# Patient Record
Sex: Female | Born: 1955 | Race: Black or African American | Hispanic: No | Marital: Married | Smoking: Current every day smoker
Health system: Southern US, Community
[De-identification: ages and names within clinical notes are randomized; demographics above are authoritative.]

---

## 2006-09-04 ENCOUNTER — Emergency Department: Payer: Self-pay | Admitting: Emergency Medicine

## 2006-09-04 ENCOUNTER — Other Ambulatory Visit: Payer: Self-pay

## 2008-01-14 ENCOUNTER — Emergency Department: Payer: Self-pay | Admitting: Emergency Medicine

## 2008-01-21 ENCOUNTER — Emergency Department: Payer: Self-pay | Admitting: Emergency Medicine

## 2009-02-23 ENCOUNTER — Emergency Department: Payer: Self-pay | Admitting: Emergency Medicine

## 2009-02-24 ENCOUNTER — Ambulatory Visit: Payer: Self-pay | Admitting: Internal Medicine

## 2009-03-08 ENCOUNTER — Ambulatory Visit: Payer: Self-pay | Admitting: Internal Medicine

## 2009-06-19 ENCOUNTER — Emergency Department: Payer: Self-pay | Admitting: Internal Medicine

## 2009-06-20 ENCOUNTER — Emergency Department: Payer: Self-pay | Admitting: Emergency Medicine

## 2011-05-07 ENCOUNTER — Emergency Department: Payer: Self-pay | Admitting: Emergency Medicine

## 2011-09-03 IMAGING — CT CT HEAD WITHOUT CONTRAST
2 series · 15 of 30 positions shown, 19 images · non-contrast
Comparison: none

REASON FOR EXAM: ha  possible seixure, near syncope    pt in [facility] [place]
COMMENTS:

PROCEDURE:     CT  - CT HEAD WITHOUT CONTRAST  - June 19, 2009 [DATE]
RESULT:     Comparison:  None
TECHNIQUE: Multiple axial images from the foramen magnum to the vertex were
obtained without IV contrast.

[Series 2: without · axial · non-contrast · 0.41mm/px · z∈[-150,-30]mm · 13 of 29 slices shown, 17 images]
[im 3/29  brain]
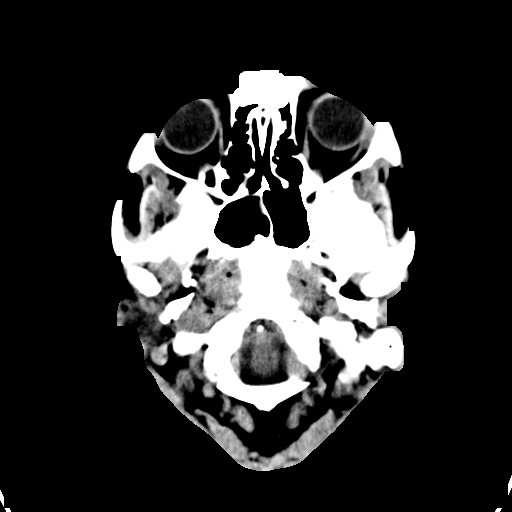
[im 3/29  bone]
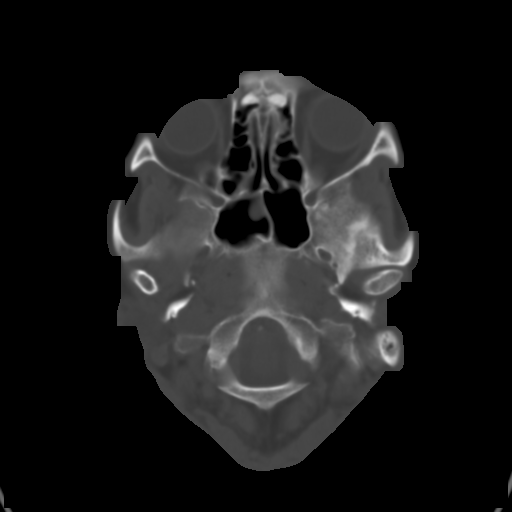
[im 5/29  brain]
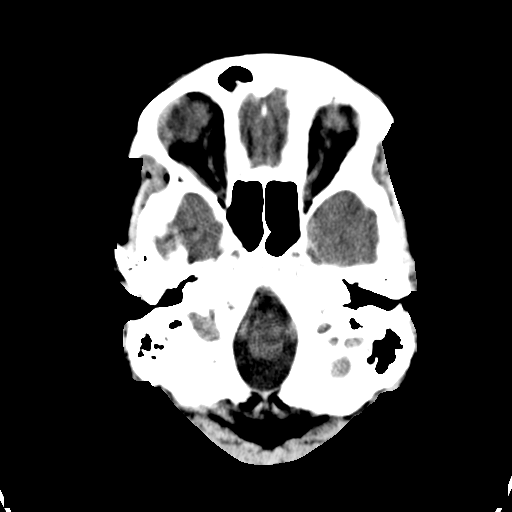
[im 7/29  brain]
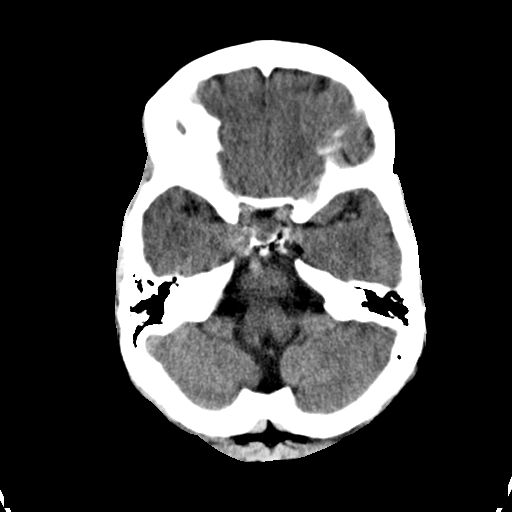
[im 9/29  brain]
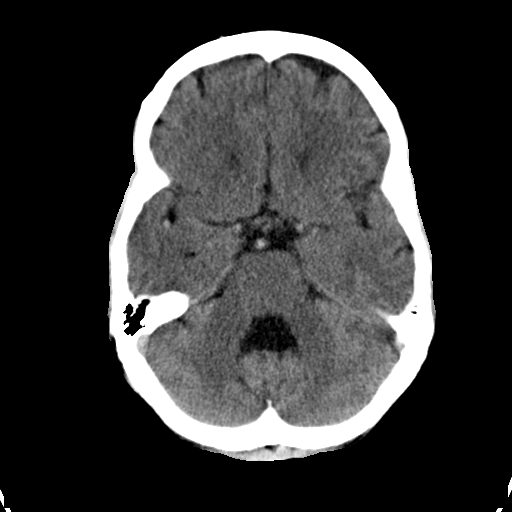
[im 11/29  brain]
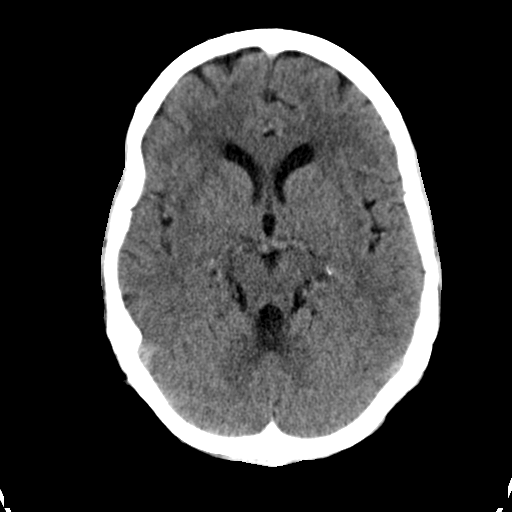
[im 11/29  bone]
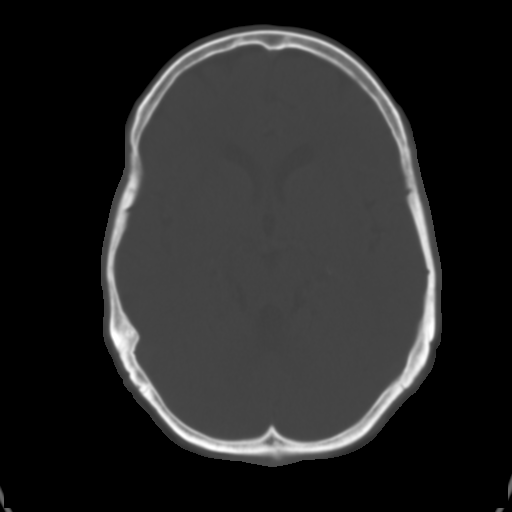
[im 13/29  brain]
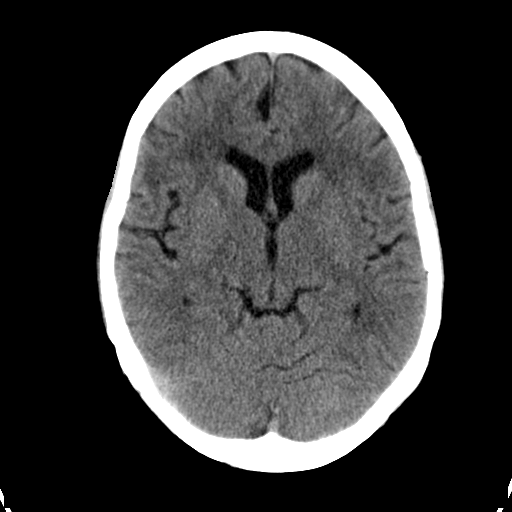
[im 15/29  brain]
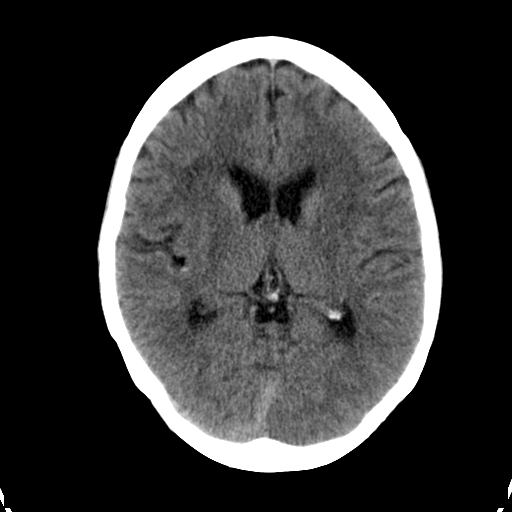
[im 17/29  brain]
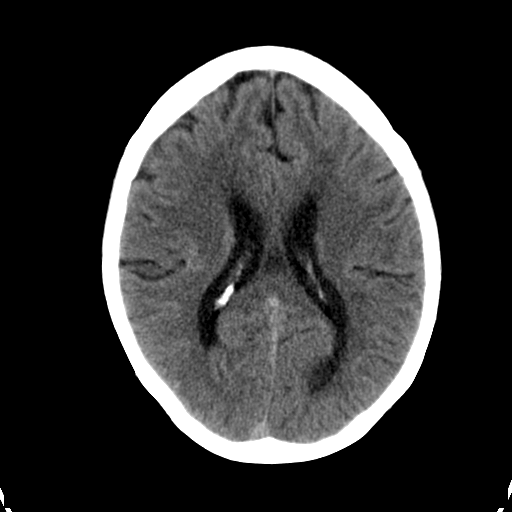
[im 19/29  brain]
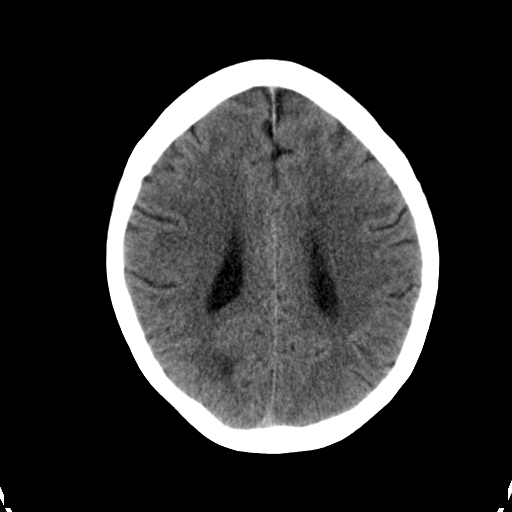
[im 19/29  bone]
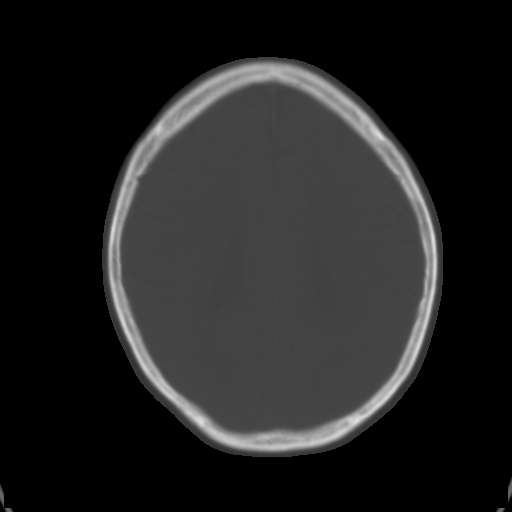
[im 21/29  brain]
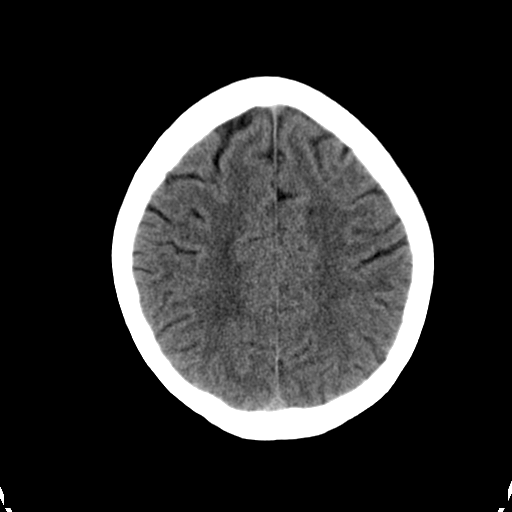
[im 23/29  brain]
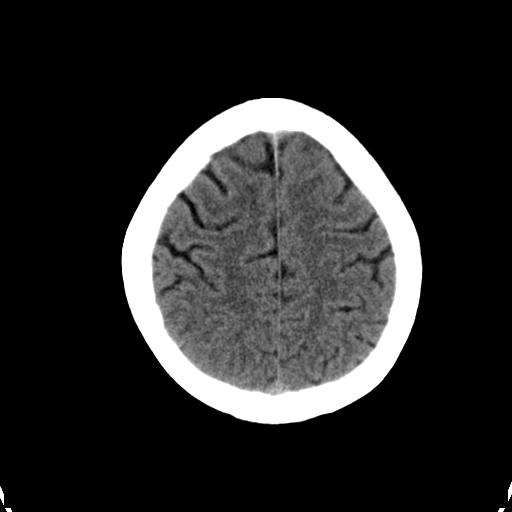
[im 25/29  brain]
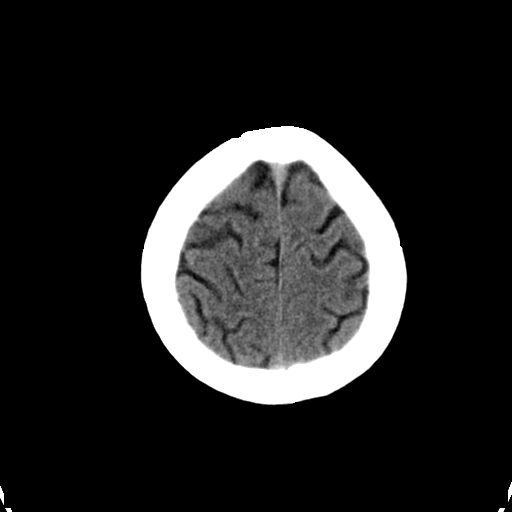
[im 27/29  brain]
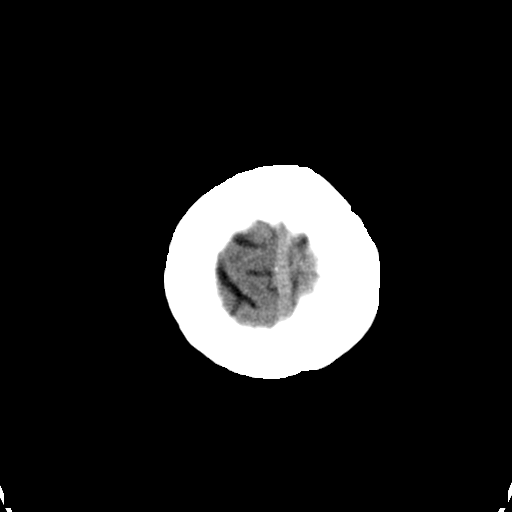
[im 27/29  bone]
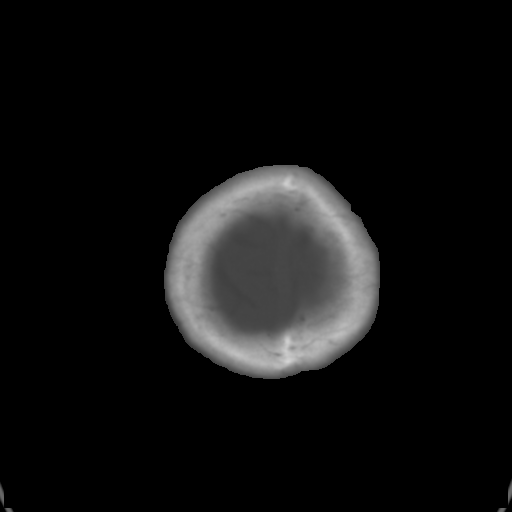

[Series 3: bone · axial · 0.41mm/px · z∈[-150,-130]mm · 2 of 29 slices shown]
[im 3/29  bone]
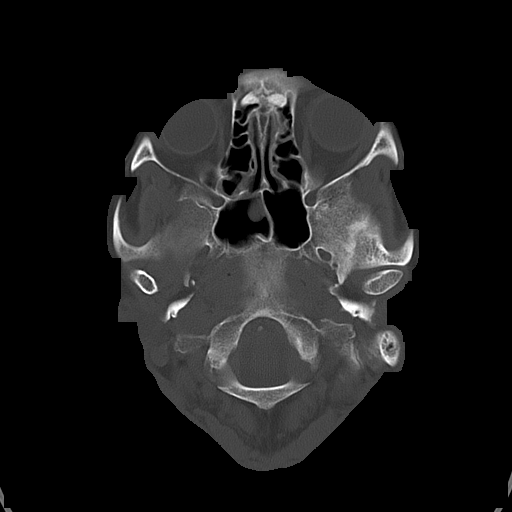
[im 7/29  bone]
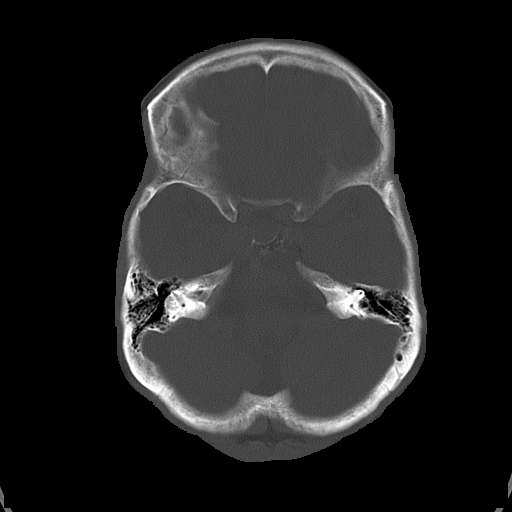

[15 of 30 positions shown; findings below may reference images not displayed]

FINDINGS: There is no evidence of mass effect, midline shift, or extra-axial fluid
collections.  There is no evidence of a space-occupying lesion or
intracranial hemorrhage. There is no evidence of a cortical-based area of
acute infarction.  There is patchy periventricular and deep white matter
white matter low attenuation likely secondary to microangiopathy.

The ventricles and sulci are appropriate for the patient's age. The basal
cisterns are patent.

Visualized portions of the orbits are unremarkable. The visualized portions
of the paranasal sinuses and mastoid air cells are unremarkable.

The osseous structures are unremarkable.
IMPRESSION: No acute intracranial process.

CT can underestimate ischemia in the first 24 hours after the event. If
there is clinical concern for an acute infarct, a followup MRI or repeat CT
scan in 24 hours may provide additional information.

## 2016-02-29 ENCOUNTER — Encounter: Payer: Self-pay | Admitting: Emergency Medicine

## 2016-02-29 ENCOUNTER — Emergency Department
Admission: EM | Admit: 2016-02-29 | Discharge: 2016-02-29 | Disposition: A | Discharge status: Home / Self Care | Payer: Self-pay | Attending: Emergency Medicine | Admitting: Emergency Medicine

## 2016-02-29 DIAGNOSIS — K08409 Partial loss of teeth, unspecified cause, unspecified class: Secondary | ICD-10-CM

## 2016-02-29 DIAGNOSIS — K068 Other specified disorders of gingiva and edentulous alveolar ridge: Secondary | ICD-10-CM | POA: Insufficient documentation

## 2016-02-29 DIAGNOSIS — Z98818 Other dental procedure status: Secondary | ICD-10-CM | POA: Insufficient documentation

## 2016-02-29 DIAGNOSIS — F17200 Nicotine dependence, unspecified, uncomplicated: Secondary | ICD-10-CM | POA: Insufficient documentation

## 2016-02-29 LAB — CBC
HEMATOCRIT: 42.9 % (ref 35.0–47.0)
Hemoglobin: 14.5 g/dL (ref 12.0–16.0)
MCH: 33 pg (ref 26.0–34.0)
MCHC: 33.9 g/dL (ref 32.0–36.0)
MCV: 97.4 fL (ref 80.0–100.0)
Platelets: 178 10*3/uL (ref 150–440)
RBC: 4.4 MIL/uL (ref 3.80–5.20)
RDW: 14.2 % (ref 11.5–14.5)
WBC: 12.2 10*3/uL — AB (ref 3.6–11.0)

## 2016-02-29 NOTE — ED Provider Notes (Signed)
[facility] Emergency Department Provider Note   ____________________________________________   First MD Initiated Contact with Patient 02/29/16 [phone]     (approximate)  I have reviewed the triage vital signs and the nursing notes.   HISTORY  Chief Complaint Bleeding gums    HPI Veronica Edwards is a 60 y.o. female who comes into the hospital today with bleeding from her gums. The patient had [facility]. She reports that since then she has been bleeding. She reports that she's having blood clots coming out of her mouth. She reports that they put some stitches in and she feels like those are coming out as well. The patient reports that she thought it would go away but it didn't. She has been bleeding since yesterday afternoon. She reports that it was very heavy bleeding earlier. The patient did wince and mouth out with some very ice cold water. She states that she's lost a lot of blood. She's had some mild dizziness. She woke up this morning with clots in her mouth and decided to come in. She reports that she did not get discharge paperwork as to what to handle and what to expect after this procedure. She's been taking her pain medicine and has no pain at this time. She is here for evaluation.   History reviewed. No pertinent past medical history.  There are no active problems to display for this patient.   History reviewed. No pertinent surgical history.  Prior to Admission medications   Not on File    Allergies Patient has no known allergies.  No family history on file.  Social History Social History  Substance Use Topics  . Smoking status: Current Every Day Smoker    Packs/day: 0.50  . Smokeless tobacco: Never Used  . Alcohol use Yes     Comment: occas    Review of Systems Constitutional: No fever/chills Eyes: No visual changes. ENT: Bleeding gums Cardiovascular: Denies chest pain. Respiratory: Denies  shortness of breath. Gastrointestinal: No abdominal pain.  No nausea, no vomiting.  No diarrhea.  No constipation. Genitourinary: Negative for dysuria. Musculoskeletal: Negative for back pain. Skin: Negative for rash. Neurological: Negative for headaches, focal weakness or numbness.  10-point ROS otherwise negative.  ____________________________________________   PHYSICAL EXAM:  VITAL SIGNS: ED Triage Vitals  Enc Vitals Group     BP 02/29/16 0444 126/71     Pulse Rate 02/29/16 0444 68     Resp 02/29/16 0444 18     Temp 02/29/16 0444 98.2 F (36.8 C)     Temp Source 02/29/16 0444 Oral     SpO2 02/29/16 0444 95 %     Weight 02/29/16 0445 115 lb (52.2 kg)     Height 02/29/16 0445 5\' 5"  (1.651 m)     Head Circumference --      Peak Flow --      Pain Score --      Pain Loc --      Pain Edu? --      Excl. in GC? --     Constitutional: Alert and oriented. Well appearing and in no acute distress. Eyes: Conjunctivae are normal. PERRL. EOMI. Head: Atraumatic. Nose: No congestion/rhinnorhea. Mouth/Throat: Mucous membranes are moist.  Oropharynx non-erythematous. Patient is edentulous with lots of clots in her sockets all over her mouth. Stitches to the patient's lower jaw and upper jaw. No active bleeding Cardiovascular: Normal rate, regular rhythm. Grossly normal heart sounds.  Good  peripheral circulation. Respiratory: Normal respiratory effort.  No retractions. Lungs CTAB. Gastrointestinal: Soft and nontender. No distention.  Musculoskeletal: No lower extremity tenderness nor edema.   Neurologic:  Normal speech and language.  Skin:  Skin is warm, dry and intact.  Psychiatric: Mood and affect are normal.   ____________________________________________   LABS (all labs ordered are listed, but only abnormal results are displayed)  Labs Reviewed  CBC - Abnormal; Notable for the following:       Result Value   WBC 12.2 (*)    All other components within normal limits    ____________________________________________  EKG  none ____________________________________________  RADIOLOGY  none ____________________________________________   PROCEDURES  Procedure(s) performed: None  Procedures  Critical Care performed: No  ____________________________________________   INITIAL IMPRESSION / ASSESSMENT AND PLAN / ED COURSE  Pertinent labs & imaging results that were available during my care of the patient were reviewed by me and considered in my medical decision making (see chart for details).  This is a 60 year old female who comes into the hospital today with some bleeding after she had her teeth removed. The patient is not having any active bleeding at this time. She keeps mentioning that she bled a lot and she lost a lot of blood. I did offer to check the patient's blood counts which she did request. The patient's H&H is unremarkable at this time. As she is no longer actively bleeding I will discharge the patient home and have [facility]. Otherwise the patient has no further complaints or concerns and she'll be discharged home.  Clinical Course      ____________________________________________   FINAL CLINICAL IMPRESSION(S) / ED DIAGNOSES  Final diagnoses:  Bleeding gums  S/P tooth extraction      NEW MEDICATIONS STARTED DURING THIS VISIT:  New Prescriptions   No medications on file     Note:  This document was prepared using Dragon voice recognition software and may include unintentional dictation errors.    Rebecka ApleyAllison P Aslynn Brunetti, MD 02/29/16 0630

## 2016-02-29 NOTE — Discharge Instructions (Signed)
Please follow-up with the dental school for your postoperative care. Please return with any further questions or concerns.

## 2016-02-29 NOTE — ED Notes (Signed)
Per Dr. Manson PasseyBrown, ice pack applied to gums by patient.

## 2016-02-29 NOTE — ED Triage Notes (Signed)
Pt presents to ED via [facility] with c/o gum bleeding. Pt states had 21 teeth removed at [facility] yesterday, states bleeding has not stopped bleeding. Pt does not have any teeth, bleeding noticed. Pt denies being on blood thinners. Took 2 hydrocodone pills at 1:30 am this morning.

## 2019-01-27 ENCOUNTER — Ambulatory Visit (LOCAL_COMMUNITY_HEALTH_CENTER): Payer: Self-pay

## 2019-01-27 ENCOUNTER — Other Ambulatory Visit: Payer: Self-pay

## 2019-01-27 DIAGNOSIS — Z111 Encounter for screening for respiratory tuberculosis: Secondary | ICD-10-CM

## 2019-01-30 ENCOUNTER — Other Ambulatory Visit: Payer: Self-pay

## 2019-01-30 ENCOUNTER — Ambulatory Visit (LOCAL_COMMUNITY_HEALTH_CENTER): Payer: Self-pay

## 2019-01-30 DIAGNOSIS — Z111 Encounter for screening for respiratory tuberculosis: Secondary | ICD-10-CM

## 2019-01-30 LAB — TB SKIN TEST
Induration: 0 mm
TB Skin Test: NEGATIVE

## 2023-04-29 ENCOUNTER — Other Ambulatory Visit: Payer: Self-pay | Admitting: Specialist

## 2023-04-29 DIAGNOSIS — Z87891 Personal history of nicotine dependence: Secondary | ICD-10-CM

## 2023-04-29 DIAGNOSIS — J449 Chronic obstructive pulmonary disease, unspecified: Secondary | ICD-10-CM

## 2023-05-16 ENCOUNTER — Ambulatory Visit: Payer: Medicare Other

## 2023-05-29 ENCOUNTER — Ambulatory Visit
